# Patient Record
Sex: Male | Born: 1965 | Race: White | Hispanic: No | Marital: Single | Smoking: Current every day smoker
Health system: Southern US, Community
[De-identification: ages and names within clinical notes are randomized; demographics above are authoritative.]

---

## 2020-09-23 ENCOUNTER — Encounter: Payer: Self-pay | Admitting: Emergency Medicine

## 2020-09-23 ENCOUNTER — Other Ambulatory Visit: Payer: Self-pay

## 2020-09-23 ENCOUNTER — Ambulatory Visit
Admission: EM | Admit: 2020-09-23 | Discharge: 2020-09-23 | Disposition: A | Discharge status: Home / Self Care | Payer: No Typology Code available for payment source

## 2020-09-23 ENCOUNTER — Ambulatory Visit (INDEPENDENT_AMBULATORY_CARE_PROVIDER_SITE_OTHER): Payer: No Typology Code available for payment source

## 2020-09-23 DIAGNOSIS — M25562 Pain in left knee: Secondary | ICD-10-CM

## 2020-09-23 DIAGNOSIS — M25462 Effusion, left knee: Secondary | ICD-10-CM

## 2020-09-23 DIAGNOSIS — S8391XA Sprain of unspecified site of right knee, initial encounter: Secondary | ICD-10-CM | POA: Diagnosis not present

## 2020-09-23 MED ORDER — MELOXICAM 15 MG PO TBDP: 15.0000 mg | ORAL_TABLET | Freq: Every day | ORAL | 0 refills | Status: AC

## 2020-09-23 NOTE — Discharge Instructions (Addendum)
Xray today is negative for fractures or dislocations.  We will treat like a sprain for now.  Continue to wear the brace when you are up and active.  May use ice to the area.  I have sent in Meloxicam for you to take daily for pain and inflammation. May use ice to the area. May use OTC topical rubs like voltaren gel or icy hot.   Follow up with this office or with primary care if symptoms are persisting.  Follow up in the ER for high fever, trouble swallowing, trouble breathing, other concerning symptoms.

## 2020-09-23 NOTE — ED Triage Notes (Signed)
Patient states he felt something pop in left knee on Sunday when he was getting up from a kneeling position.

## 2020-09-23 NOTE — ED Provider Notes (Addendum)
[facility]   [id number] 09/23/20 Arrival Time: 1100  XT:KWIOX PAIN  SUBJECTIVE: History from: patient. Tommy Wall is a 55 y.o. male complains of left knee pain that began 2 days ago.  Reports that he was kneeling and planting a tree, when he stood up he felt something pop in his left knee on the medial aspect, reports that he has had pain and swelling since then.  Reports that he was at work yesterday and the pain and swelling was worse.  Has been wearing a hinged knee brace to the left knee. Has not been taking any OTC medications. Has not had this issue before. Symptoms are made worse with activity. Denies similar symptoms in the past. Denies fever, chills, erythema, ecchymosis, weakness, numbness and tingling, saddle paresthesias, loss of bowel or bladder function.      ROS: As per HPI.  All other pertinent ROS negative.     History reviewed. No pertinent past medical history. History reviewed. No pertinent surgical history. Allergies  Allergen Reactions  . Eggs Or Egg-Derived Products Swelling  . Penicillins Rash   No current facility-administered medications on file prior to encounter.   Current Outpatient Medications on File Prior to Encounter  Medication Sig Dispense Refill  . valACYclovir (VALTREX) 500 MG tablet Take 600 mg by mouth daily.     Social History   Socioeconomic History  . Marital status: Single    Spouse name: Not on file  . Number of children: Not on file  . Years of education: Not on file  . Highest education level: Not on file  Occupational History  . Not on file  Tobacco Use  . Smoking status: Current Every Day Smoker    Packs/day: 1.00    Types: Cigarettes  . Smokeless tobacco: Never Used  Vaping Use  . Vaping Use: Never used  Substance and Sexual Activity  . Alcohol use: Yes  . Drug use: Never  . Sexual activity: Not on file  Other Topics Concern  . Not on file  Social History Narrative  . Not on file   Social Determinants of  Health   Financial Resource Strain: Not on file  Food Insecurity: Not on file  Transportation Needs: Not on file  Physical Activity: Not on file  Stress: Not on file  Social Connections: Not on file  Intimate Partner Violence: Not on file   Family History  Problem Relation Age of Onset  . Thyroid disease Mother   . Osteoarthritis Mother     OBJECTIVE:  Vitals:   09/23/20 1119 09/23/20 1123  BP:  134/90  Pulse:  61  Resp:  16  Temp:  98.2 F (36.8 C)  TempSrc:  Oral  SpO2:  96%  Weight: 234 lb (106.1 kg)   Height: 6' (1.829 m)     General appearance: ALERT; in no acute distress.  Head: NCAT Lungs: Normal respiratory effort CV: pulses 2+ bilaterally. Cap refill < 2 seconds Musculoskeletal:  Inspection: Skin warm, dry, clear and intact.  Obvious effusion noted to medial aspect of left knee Palpation: Medial aspect of left knee tender to palpation ROM: Limited ROM active and passive to left Skin: warm and dry Neurologic: Ambulates without difficulty; Sensation intact about the upper/ lower extremities Psychological: alert and cooperative; normal mood and affect  DIAGNOSTIC STUDIES:  DG Knee Complete 4 Views Left  Result Date: 09/23/2020 CLINICAL DATA:  Pain and swelling EXAM: LEFT KNEE - COMPLETE 4+ VIEW COMPARISON:  None. FINDINGS: Frontal, lateral, and  bilateral oblique views were obtained. No fracture, dislocation, or joint effusion. No appreciable joint space narrowing. No erosion. IMPRESSION: No fracture, dislocation, or joint effusion. No appreciable arthropathy. Electronically Signed   By: Bretta Bang III M.D.   On: 09/23/2020 11:57     ASSESSMENT & PLAN:  1. Sprain of right knee, unspecified ligament, initial encounter   2. Acute pain of left knee   3. Effusion of left knee      Meds ordered this encounter  Medications  . Meloxicam 15 MG TBDP    Sig: Take 15 mg by mouth daily.    Dispense:  30 tablet    Refill:  0    Order Specific Question:    Supervising Provider    Answer:   Merrilee Jansky [[id number]]   X-ray today negative for fracture or dislocation Continue wearing brace when up and active Suspect soft tissue injury that may need further imaging Prescribed meloxicam.   Do not take ibuprofen with this medication Continue conservative management of rest, ice, and gentle stretches Take meloxicam as prescribed for pain relief (may cause abdominal discomfort, ulcers, and GI bleeds avoid taking with other NSAIDs)  Follow up with ortho if symptoms persist Return or go to the ER if you have any new or worsening symptoms (fever, chills, chest pain, abdominal pain, changes in bowel or bladder habits, pain radiating into lower legs)   Reviewed expectations re: course of current medical issues. Questions answered. Outlined signs and symptoms indicating need for more acute intervention. Patient verbalized understanding. After Visit Summary given.       Moshe Cipro, NP 09/23/20 1158    Moshe Cipro, NP 09/23/20 1203

## 2021-12-21 IMAGING — DX DG KNEE COMPLETE 4+V*L*
4 series · 4 of 4 positions shown · non-contrast
Comparison: None.

CLINICAL DATA: Pain and swelling

EXAM:
LEFT KNEE - COMPLETE 4+ VIEW

[knee ap]
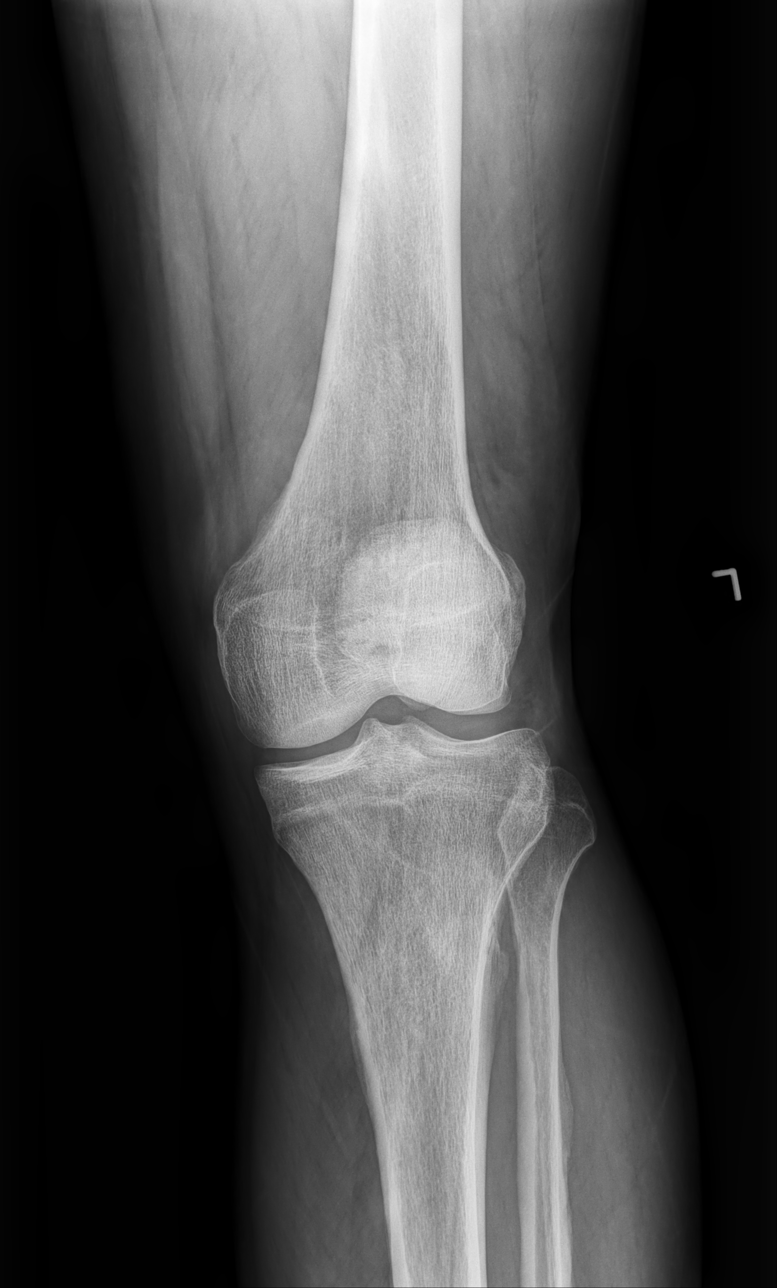

[knee mlo (1 of 2)]
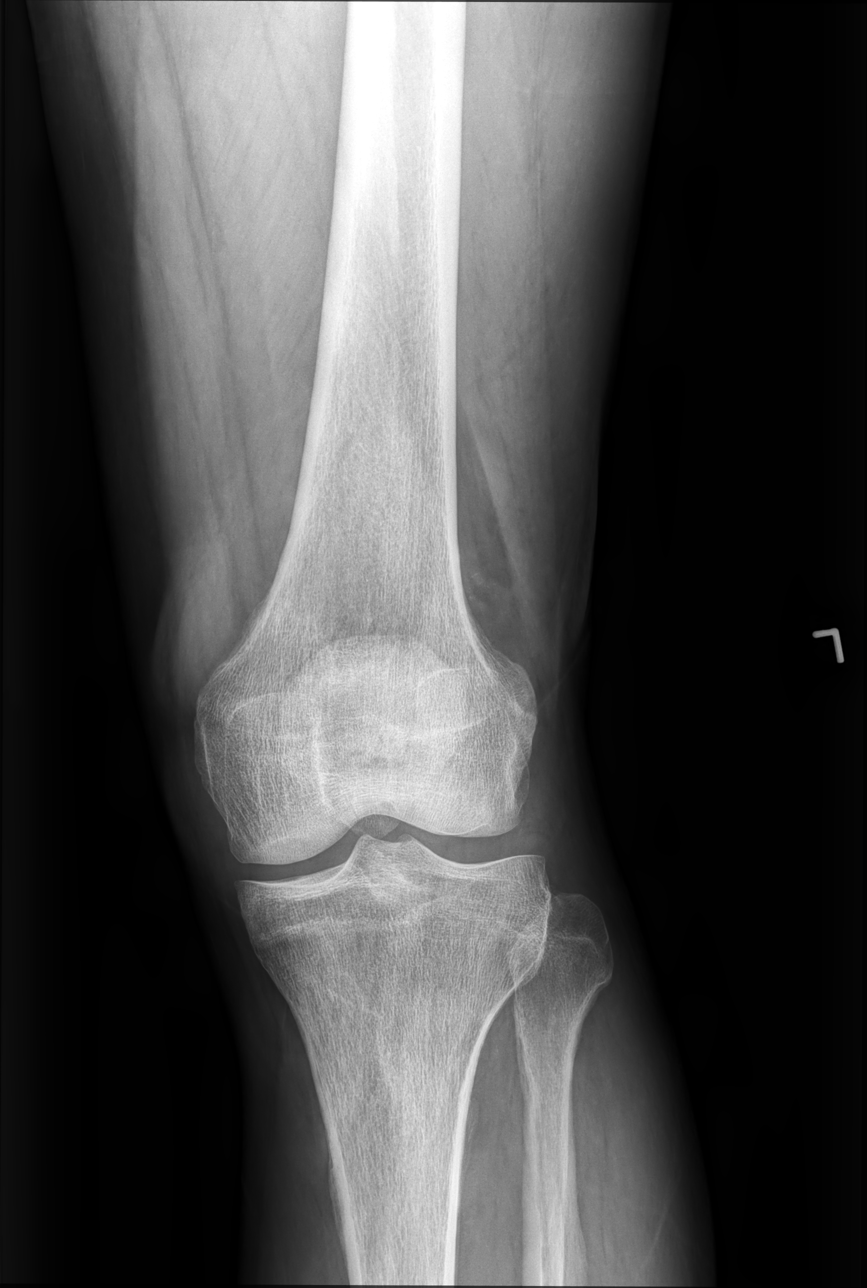

[knee mlo (2 of 2)]
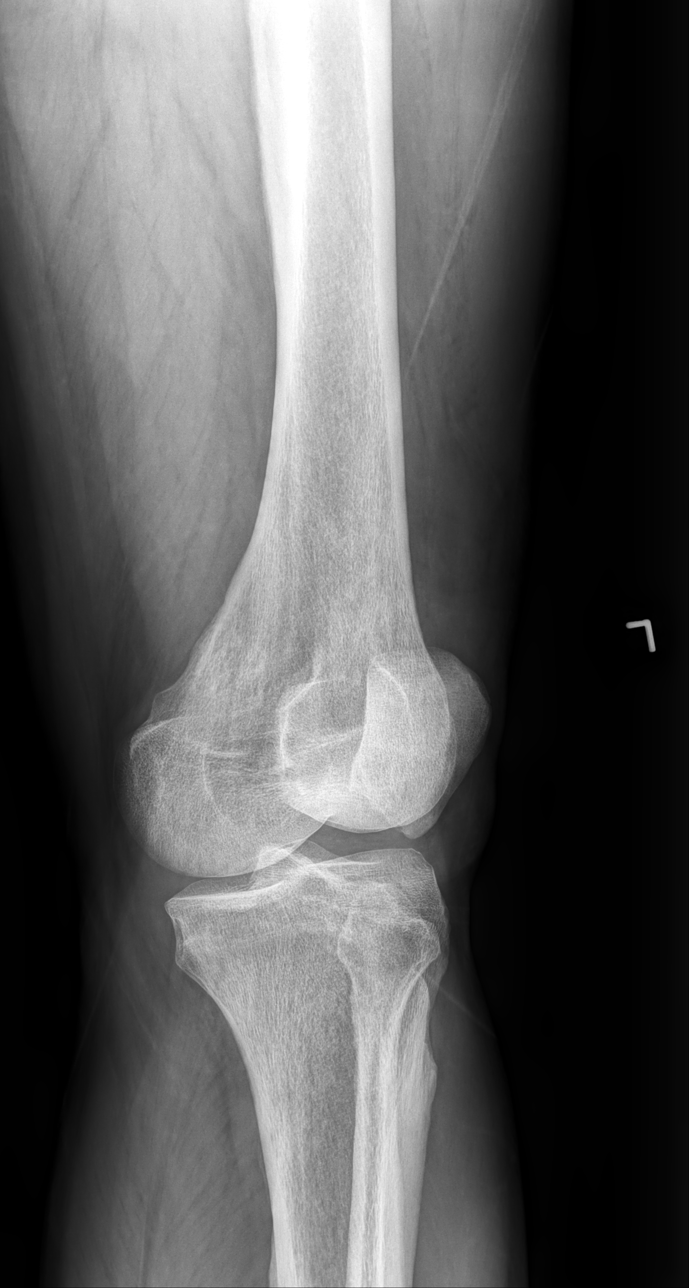

[knee lat]
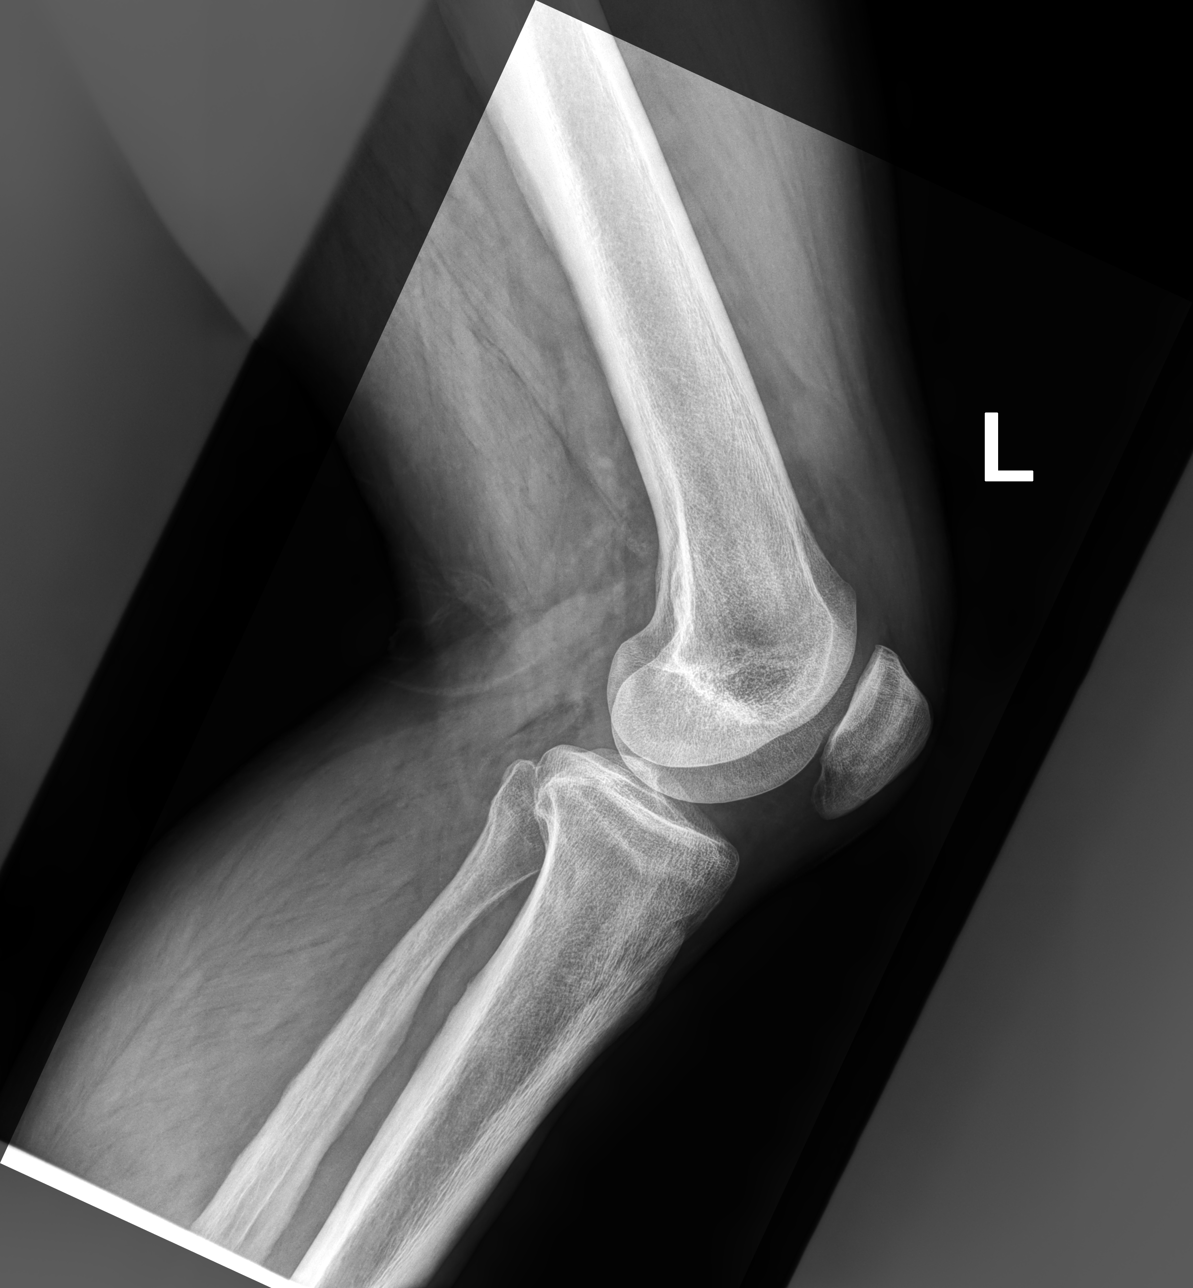

[4 of 4 positions shown; findings below may reference images not displayed]

FINDINGS: Frontal, lateral, and bilateral oblique views were obtained. No
fracture, dislocation, or joint effusion. No appreciable joint space
narrowing. No erosion.
IMPRESSION: No fracture, dislocation, or joint effusion. No appreciable
arthropathy.
